# Patient Record
Sex: Male | Born: 1965 | Race: Black or African American | Hispanic: No | Marital: Single
Health system: Midwestern US, Community
[De-identification: ages and names within clinical notes are randomized; demographics above are authoritative.]

---

## 2010-11-23 IMAGING — CR Chest PA and Left Lateral
2 series · 2 of 2 positions shown · non-contrast
Comparison: none

Final Report

PA and lateral chest
HISTORY: Tbscreening;

[PA]
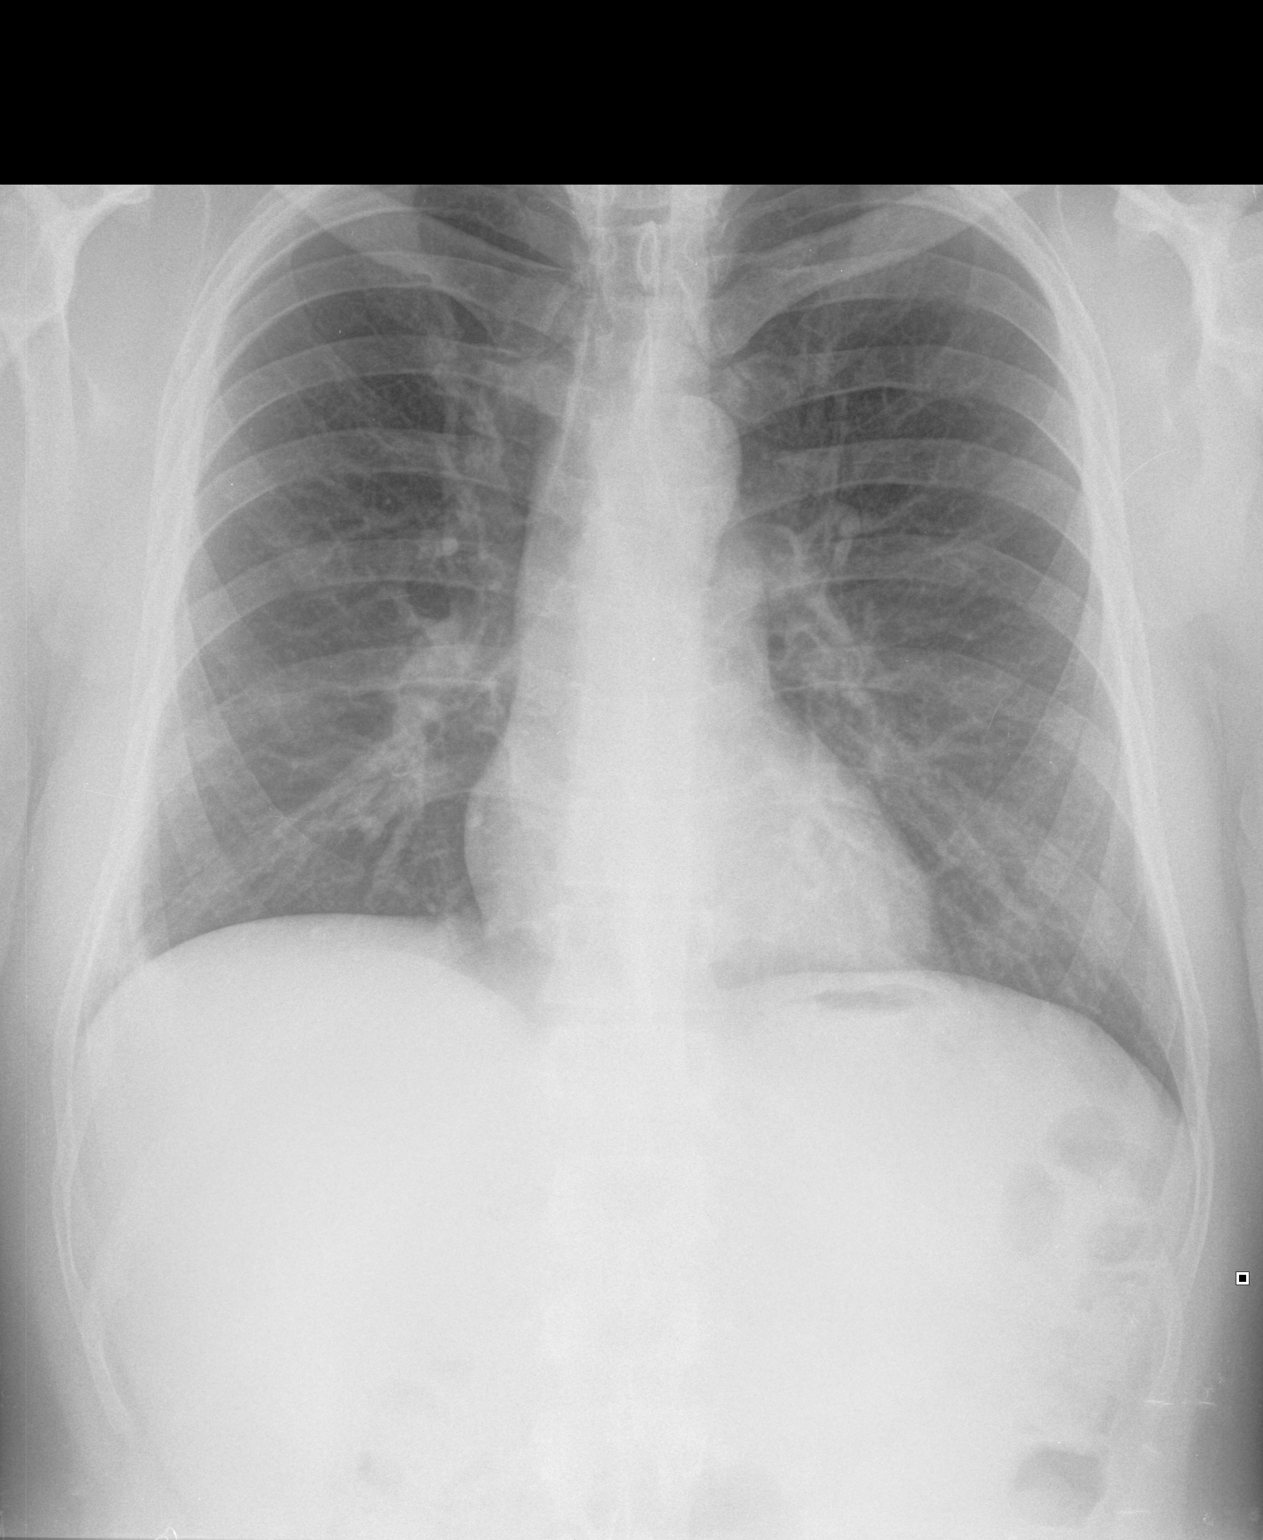

[left lateral]
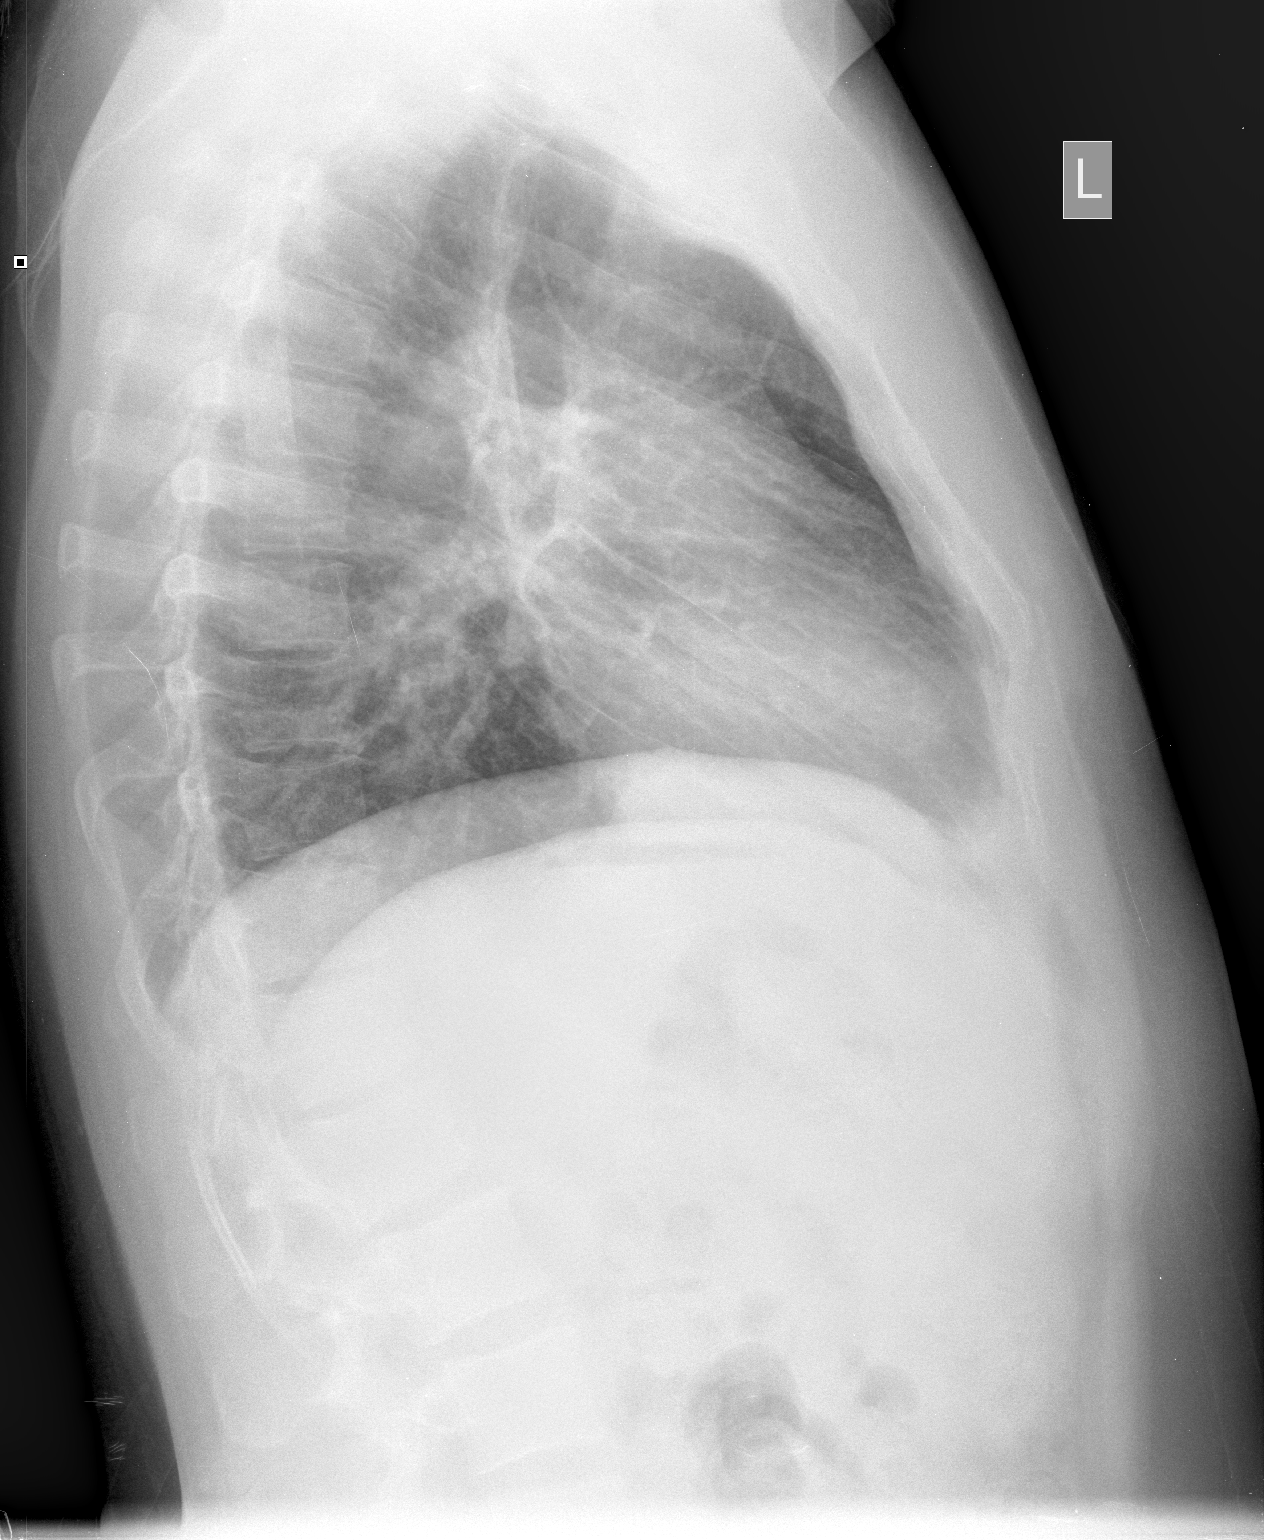

[2 of 2 positions shown; findings below may reference images not displayed]

FINDINGS: The heart size is normal.  No congestive heart failure or 

acute infiltrates.  Costophrenic angles are sharp and the trachea is 

midline.  Hila structures outline normally.

No significant bony abnormality is identified.
IMPRESSION: Negative PA and lateral chest x-ray.

## 2013-08-15 NOTE — ED Provider Notes (Addendum)
Patient is a 47 y.o. male presenting with cough. The history is provided by the patient.   Cough  This is a new problem. The current episode started more than 1 week ago. The problem occurs constantly. The problem has been gradually worsening. The cough is productive of sputum. There has been no fever. Associated symptoms include wheezing. Pertinent negatives include no chest pain, no chills, no sweats, no weight loss, no eye redness, no ear congestion, no ear pain, no headaches, no rhinorrhea, no sore throat, no myalgias, no shortness of breath, no nausea, no vomiting and no confusion. He has tried antibiotics for the symptoms. He is a smoker.      Patient arrived with chest congestion and cough that has been occuring for 2 months.  Patient states that 2 weeks ago he was given Zpack which helped on a short term basis but then it did not resolve completely.  Patient arrived for evaluation.     History reviewed. No pertinent past medical history.     History reviewed. No pertinent past surgical history.      History reviewed. No pertinent family history.     History     Social History   ??? Marital Status: SINGLE     Spouse Name: N/A     Number of Children: N/A   ??? Years of Education: N/A     Occupational History   ??? Not on file.     Social History Main Topics   ??? Smoking status: Heavy Tobacco Smoker -- 0.50 packs/day for 15 years   ??? Smokeless tobacco: Not on file   ??? Alcohol Use: Yes      Comment: socially   ??? Drug Use: No   ??? Sexually Active: Yes -- Male partner(s)     Other Topics Concern   ??? Not on file     Social History Narrative   ??? No narrative on file                  ALLERGIES: Review of patient's allergies indicates no known allergies.      Review of Systems   Constitutional: Negative.  Negative for chills and weight loss.   HENT: Negative.  Negative for ear pain, sore throat and rhinorrhea.    Eyes: Negative.  Negative for redness.   Respiratory: Positive for cough and wheezing. Negative for apnea,  choking, chest tightness, shortness of breath and stridor.    Cardiovascular: Negative.  Negative for chest pain.   Gastrointestinal: Negative.  Negative for nausea and vomiting.   Endocrine: Negative.    Genitourinary: Negative.    Musculoskeletal: Negative.  Negative for myalgias.   Skin: Negative.    Allergic/Immunologic: Negative.    Neurological: Negative.  Negative for headaches.   Hematological: Negative.    Psychiatric/Behavioral: Negative.  Negative for confusion.   All other systems reviewed and are negative.        Filed Vitals:    08/15/13 2003   BP: 133/83   Pulse: 90   Temp: 98.2 ??F (36.8 ??C)   Resp: 19   Height: 5\' 8"  (1.727 m)   Weight: 92.987 kg (205 lb)   SpO2: 98%            Physical Exam   Nursing note and vitals reviewed.  Constitutional: He is oriented to person, place, and time. He appears well-developed and well-nourished.   HENT:   Head: Normocephalic and atraumatic.   Right Ear: External ear normal.   Left  Ear: External ear normal.   Nose: Nose normal.   Mouth/Throat: Oropharynx is clear and moist.   Eyes: EOM are normal. Pupils are equal, round, and reactive to light.   Neck: Normal range of motion. Neck supple.   Cardiovascular: Normal rate, regular rhythm, normal heart sounds and intact distal pulses.    Pulmonary/Chest: Effort normal. No apnea. No respiratory distress. He has wheezes in the right upper field, the right middle field, the right lower field, the left upper field, the left middle field and the left lower field.   Abdominal: Soft. Bowel sounds are normal. He exhibits no distension. There is no tenderness.   Musculoskeletal: Normal range of motion.   Neurological: He is alert and oriented to person, place, and time. He has normal strength and normal reflexes. No cranial nerve deficit or sensory deficit.   Skin: Skin is warm and dry.   Psychiatric: He has a normal mood and affect. His behavior is normal. Judgment and thought content normal.        MDM     Amount and/or  Complexity of Data Reviewed:   Tests in the radiology section of CPT??:  Ordered and reviewed  Discussion of test results with the performing providers:  Yes   Decide to obtain previous medical records or to obtain history from someone other than the patient:  Yes   Obtain history from someone other than the patient:  Yes   Review and summarize past medical records:  Yes   Discuss the patient with another provider:  Yes   Independant visualization of image, tracing, or specimen:  Yes  Risk of Significant Complications, Morbidity, and/or Mortality:   Presenting problems:  Moderate  Diagnostic procedures:  Moderate  Management options:  Moderate  Progress:   Patient progress:  Stable      Procedures    Labs:  No results found for this or any previous visit (from the past 12 hour(s)).    EKG:    Radiology:  XR CHEST PA LAT (Final result)  Result time: 08/15/13 21:30:49      Final result by Chapman Fitch, MD (08/15/13 21:30:49)      Impression:    IMPRESSION:   No focal infiltrate or effusion        Narrative:    History: cough, chest congestion    Technique: PA and Lateral views of the chest 08/15/2013 8:20 PM    Comparison: none.     FINDINGS:   No focal infiltrate or effusion is identified.   The cardiac silhouette is unremarkable.  The visualized osseous structures appear unremarkable.          Re-evaluations:    Consults:    <EMERGENCY DEPARTMENT CASE SUMMARY>    Impression/Differential Diagnosis: bronchitis, wheezing    Plan: prednisone rx, alb rx, neb rx, mask and tubing rx, omnicef rx, tessalon pearles rx    ED Course: chest xray, duoneb, prednisone, patient's symptoms improved    Final Impression/Diagnosis: bronchitis, wheezing    Patient condition at time of disposition: stable      I have reviewed the following home medications:    Prior to Admission medications    Not on File       Debby Maisie Fus, [place]  I was personally available for consultation in the emergency department.  I have reviewed the chart recorded by  the [facility], including the assessment, treatment plan, and disposition.  Teola Bradley, MD

## 2013-08-15 NOTE — ED Notes (Signed)
Pt given 60mg  po prednisone and 1 duoneb tx pt on spo2  monitor

## 2013-08-15 NOTE — ED Notes (Signed)
Patient came to ED with chest congestion and yellowish phlegm for 2 months. Patient was given by PMD Z pak but still with symptoms.

## 2013-08-16 MED ADMIN — albuterol-ipratropium (DUO-NEB) 2.5 MG-0.5 MG/3 ML: RESPIRATORY_TRACT | NDC 00487020101

## 2013-08-16 MED ADMIN — prednisone (DELTASONE) tablet 60 mg: ORAL | NDC 00054001820

## 2013-11-25 LAB — DRUG SCREEN, URINE
AMPHETAMINES: POSITIVE — AB
BARBITURATES: NEGATIVE
BENZODIAZEPINES: NEGATIVE
COCAINE: NEGATIVE
METHADONE: POSITIVE — AB
OPIATES: POSITIVE — AB
PCP(PHENCYCLIDINE): NEGATIVE
THC (TH-CANNABINOL): POSITIVE — AB

## 2013-11-25 LAB — METABOLIC PANEL, BASIC
Anion gap: 7 mmol/L — ABNORMAL LOW (ref 10–20)
BUN: 14 mg/dL (ref 7–18)
CO2: 32 mmol/L (ref 21–32)
Calcium: 9.5 mg/dL (ref 8.5–10.1)
Chloride: 105 mmol/L (ref 98–107)
Creatinine: 1.1 mg/dL (ref 0.6–1.3)
GFR est AA: >60 mL/min/{1.73_m2} (ref >60)
GFR est non-AA: >60 mL/min/{1.73_m2} (ref >60)
Glucose: 130 mg/dL — ABNORMAL HIGH (ref 74–106)
Potassium: 4.3 mmol/L (ref 3.5–5.1)
Sodium: 140 mmol/L (ref 136–145)

## 2013-11-25 LAB — CBC WITH AUTOMATED DIFF
ABS. BASOPHILS: 0 10*3/uL (ref 0.0–0.1)
ABS. EOSINOPHILS: 0.3 10*3/uL (ref 0.0–0.7)
ABS. LYMPHOCYTES: 1.7 10*3/uL (ref 1.5–3.5)
ABS. MONOCYTES: 0.5 10*3/uL (ref 0.0–1.0)
ABS. NEUTROPHILS: 4.4 10*3/uL (ref 1.5–6.6)
BASOPHILS: 0 % (ref 0.0–3.0)
EOSINOPHILS: 4 % (ref 0.0–7.0)
HCT: 45.7 % (ref 42.0–52.0)
HGB: 14.9 g/dL (ref 14.0–18.0)
IMMATURE GRANULOCYTES: 0.4 % (ref 0.0–2.0)
LYMPHOCYTES: 25 % (ref 18.0–40.0)
MCH: 30.2 PG (ref 27.0–35.0)
MCHC: 32.6 g/dL (ref 30.7–37.3)
MCV: 92.5 FL (ref 81.0–94.0)
MONOCYTES: 8 % (ref 2.0–12.0)
MPV: 8.9 FL — ABNORMAL LOW (ref 9.2–11.8)
NEUTROPHILS: 63 % (ref 48.0–72.0)
PLATELET: 217 10*3/uL (ref 130–400)
RBC: 4.94 M/uL (ref 4.70–6.00)
RDW: 13.2 % (ref 11.5–14.0)
WBC: 7 10*3/uL (ref 4.8–10.6)

## 2013-11-25 LAB — URINALYSIS W/ RFLX MICROSCOPIC
Bilirubin: NEGATIVE
Blood: NEGATIVE
Glucose: NEGATIVE mg/dL
Ketone: NEGATIVE mg/dL
Leukocyte Esterase: NEGATIVE
Nitrites: NEGATIVE
Protein: NEGATIVE mg/dL
Specific gravity: 1.023 (ref 1.003–1.030)
Urobilinogen: 0.2 EU/dL (ref 0.2–1.0)
pH (UA): 5.5 (ref 4.6–8.0)

## 2013-11-25 LAB — ACETAMINOPHEN: Acetaminophen level: <2 ug/mL — ABNORMAL LOW (ref 10.0–30.0)

## 2013-11-25 LAB — ETHYL ALCOHOL: ETHYL ALCOHOL, SERUM: <3 MG/DL (ref <10)

## 2013-11-25 LAB — SALICYLATE: Salicylate level: 2.5 MG/DL — ABNORMAL LOW (ref 2.8–20.0)

## 2013-11-25 NOTE — ED Notes (Signed)
Pt discharged to home with post ED care instructions.  Care explained to pt who verbalized understanding of same.

## 2013-11-25 NOTE — ED Provider Notes (Signed)
HPI Comments: This is a 47 yo male came to ER requesting for Detox from heroin . Last use was about at 8 pm . Patient usually uses 5 bags a day . Denied suicidal or homicidal ideation in ER.     Patient is a 47 y.o. male presenting with drug problem. The history is provided by the patient.   Drug Problem  There areno confusion, no somnolence, no loss of consciousness, no seizures, no weakness, no agitation, no delusions, no hallucinations, no self-injury, no violence and no intoxication present at this time.  This is a recurrent problem. The problem has not changed since onset.Suspected agents include heroin. Pertinent negatives include no fever, no injury, no nausea, no vomiting, no bladder incontinence and no bowel incontinence. Associated medical issues include addiction treatment. Associated medical issues do not include withdrawal syndrome, chronic illness, mental illness, psychiatric history, recent illness, recent infection, suicidal ideas, homicidal ideas, mental status change and depression.        Past Medical History   Diagnosis Date   ??? Asthma         History reviewed. No pertinent past surgical history.      History reviewed. No pertinent family history.     History     Social History   ??? Marital Status: SINGLE     Spouse Name: N/A     Number of Children: N/A   ??? Years of Education: N/A     Occupational History   ??? Not on file.     Social History Main Topics   ??? Smoking status: Heavy Tobacco Smoker -- 0.50 packs/day for 15 years   ??? Smokeless tobacco: Not on file   ??? Alcohol Use: Yes      Comment: socially   ??? Drug Use: No   ??? Sexually Active: Yes -- Male partner(s)     Other Topics Concern   ??? Not on file     Social History Narrative   ??? No narrative on file                  ALLERGIES: Review of patient's allergies indicates no known allergies.      Review of Systems   Constitutional: Negative.  Negative for fever.   HENT: Negative.    Eyes: Negative.    Respiratory: Negative.    Gastrointestinal:  Negative for nausea, vomiting and bowel incontinence.   Endocrine: Negative.    Genitourinary: Negative.  Negative for bladder incontinence.   Allergic/Immunologic: Negative.    Neurological: Negative.  Negative for seizures, loss of consciousness and weakness.   Psychiatric/Behavioral: Positive for behavioral problems. Negative for depression, suicidal ideas, homicidal ideas, hallucinations, confusion, self-injury and agitation.       Filed Vitals:    11/25/13 1103   BP: 150/92   Pulse: 71   Temp: 97.8 ??F (36.6 ??C)   Resp: 18   Height: 5\' 8"  (1.727 m)   Weight: 95.255 kg (210 lb)   SpO2: 98%            Physical Exam   Nursing note and vitals reviewed.  Constitutional: He is oriented to person, place, and time. He appears well-developed and well-nourished.   HENT:   Head: Normocephalic and atraumatic.   Eyes: Conjunctivae and EOM are normal. Pupils are equal, round, and reactive to light.   Neck: Normal range of motion. Neck supple.   Neurological: He is alert and oriented to person, place, and time. He has normal reflexes.  Skin: Skin is warm and dry.   Psychiatric: He has a normal mood and affect. Thought content normal.        MDM    Procedures    <EMERGENCY DEPARTMENT CASE SUMMARY>        ED Course:    Recent Results (from the past 12 hour(s))   CBC WITH AUTOMATED DIFF    Collection Time     11/25/13  1:00 PM       Result Value Range    WBC 7.0  4.8 - 10.6 K/uL    RBC 4.94  4.70 - 6.00 M/uL    HGB 14.9  14.0 - 18.0 g/dL    HCT 16.1  09.6 - 04.5 %    MCV 92.5  81.0 - 94.0 FL    MCH 30.2  27.0 - 35.0 PG    MCHC 32.6  30.7 - 37.3 g/dL    RDW 40.9  81.1 - 91.4 %    PLATELET 217  130 - 400 K/uL    MPV 8.9 (*) 9.2 - 11.8 FL    NEUTROPHILS 63  48.0 - 72.0 %    LYMPHOCYTES 25  18.0 - 40.0 %    MONOCYTES 8  2.0 - 12.0 %    EOSINOPHILS 4  0.0 - 7.0 %    BASOPHILS 0  0.0 - 3.0 %    ABS. NEUTROPHILS 4.4  1.5 - 6.6 K/UL    ABS. LYMPHOCYTES 1.7  1.5 - 3.5 K/UL    ABS. MONOCYTES 0.5  0.0 - 1.0 K/UL    ABS. EOSINOPHILS 0.3   0.0 - 0.7 K/UL    ABS. BASOPHILS 0.0  0.0 - 0.1 K/UL    DF AUTOMATED      IMMATURE GRANULOCYTES 0.4  0.0 - 2.0 %   METABOLIC PANEL, BASIC    Collection Time     11/25/13  1:00 PM       Result Value Range    Sodium 140  136 - 145 mmol/L    Potassium 4.3  3.5 - 5.1 mmol/L    Chloride 105  98 - 107 mmol/L    CO2 32  21 - 32 mmol/L    Anion gap 7 (*) 10 - 20 mmol/L    Glucose 130 (*) 74 - 106 mg/dL    BUN 14  7 - 18 mg/dL    Creatinine 1.1  0.6 - 1.3 mg/dL    GFR est AA >78  >29 ml/min/1.36m2    GFR est non-AA >60  >60 ml/min/1.75m2    Calcium 9.5  8.5 - 10.1 mg/dL   ETHYL ALCOHOL    Collection Time     11/25/13  1:00 PM       Result Value Range    ETHYL ALCOHOL, SERUM <3.0  <10 MG/DL   ACETAMINOPHEN    Collection Time     11/25/13  1:00 PM       Result Value Range    ACETAMINOPHEN <2 (*) 10.0 - 30.0 ug/mL   SALICYLATE    Collection Time     11/25/13  1:00 PM       Result Value Range    SALICYLATE 2.5 (*) 2.8 - 20.0 MG/DL         Patient was seen by Kirt Boys ( Mental health screener) who evaluate the patient.     Patient insurance is not active. Patient decide to leave     Final Impression/Diagnosis: substance abuse     Patient  condition at time of disposition: stable       I have reviewed the following home medications:    Prior to Admission medications    Medication Sig Start Date End Date Taking? Authorizing Provider   albuterol (PROVENTIL HFA, VENTOLIN HFA) 90 mcg/actuation inhaler Take 2 puffs by inhalation every four (4) hours as needed for Wheezing. 08/15/13   Debby Thomas, PA-C, MS   albuterol (ACCUNEB) 1.25 mg/3 mL nebulizer solution Take 3 mL by inhalation every six (6) hours as needed for Wheezing. 08/15/13   Debby Maisie Fus, PA-C, MS   Nebulizers misc 1 unit dose vial every 4-6 hours as needed for wheezing. 08/15/13   Debby Storm Frisk, MS   Nebulizer Accessories kit Mask and tubing 08/15/13   Debby Storm Frisk, MS         [place], [place]

## 2013-11-25 NOTE — ED Notes (Signed)
Pt received in waiting room.  Alert and oriented x3.  Pt ambulates to ED room with steady gait.   Pt awaiting provider evaluation.

## 2013-11-25 NOTE — ED Notes (Signed)
Pt requesting detox from heroin states he last used 5 bags yesterday at 8pm

## 2013-11-25 NOTE — ED Provider Notes (Signed)
I was personally available for consultation in the emergency department.  I have reviewed the chart and agree with the documentation recorded by the midlevel provider, including the assessment, treatment plan, and disposition.    [name] [name], MD

## 2014-11-25 ENCOUNTER — Emergency Department: Admit: 2014-11-25 | Payer: MEDICAID | Primary: Family Medicine

## 2014-11-25 ENCOUNTER — Inpatient Hospital Stay
Admit: 2014-11-25 | Discharge: 2014-11-25 | Disposition: A | Discharge status: Home / Self Care | Payer: MEDICAID | Attending: Emergency Medicine

## 2014-11-25 DIAGNOSIS — S8982XA Other specified injuries of left lower leg, initial encounter: Secondary | ICD-10-CM

## 2014-11-25 MED ORDER — OXYCODONE-ACETAMINOPHEN 5 MG-325 MG TAB
5-325 mg | ORAL_TABLET | ORAL | Status: DC | PRN
Start: 2014-11-25 — End: 2014-12-23

## 2014-11-25 MED ORDER — OXYCODONE-ACETAMINOPHEN 5 MG-325 MG TAB
5-325 mg | ORAL | Status: AC
Start: 2014-11-25 — End: 2014-11-25
  Administered 2014-11-25: 18:00:00 via ORAL

## 2014-11-25 MED FILL — OXYCODONE-ACETAMINOPHEN 5 MG-325 MG TAB: 5-325 mg | ORAL | Qty: 1

## 2014-11-25 NOTE — ED Notes (Signed)
Knee immobilizer. Crutch training completed. Patient discharged home with rx and follow up instructions.

## 2014-11-25 NOTE — ED Notes (Signed)
Twisted lt knee 2 days ago

## 2014-11-25 NOTE — ED Provider Notes (Addendum)
HPI Comments: Patient reports that he twisted his left knee. He reports that his has increased pain and weight bearing worst to his posterior knee. Denies weakness, numbness or tingling. Reports that he did not take any medications for pain.     Patient is a 48 y.o. male presenting with knee injury. The history is provided by the patient.   Knee Injury   This is a new problem. The pain is at a severity of 7/10. Pertinent negatives include no numbness, full range of motion, no stiffness, no tingling, no itching, no back pain and no neck pain. There has been no history of extremity trauma.        Past Medical History:   Diagnosis Date   ??? Asthma        History reviewed. No pertinent past surgical history.      History reviewed. No pertinent family history.    History     Social History   ??? Marital Status: SINGLE     Spouse Name: N/A     Number of Children: N/A   ??? Years of Education: N/A     Occupational History   ??? Not on file.     Social History Main Topics   ??? Smoking status: Heavy Tobacco Smoker -- 0.50 packs/day for 15 years   ??? Smokeless tobacco: Not on file   ??? Alcohol Use: Yes      Comment: socially   ??? Drug Use: No   ??? Sexual Activity:     Partners: Female     Other Topics Concern   ??? Not on file     Social History Narrative                ALLERGIES: Review of patient's allergies indicates no known allergies.      Review of Systems   Constitutional: Negative.  Negative for fever and chills.   HENT: Negative.    Respiratory: Negative.  Negative for cough and shortness of breath.    Cardiovascular: Negative.  Negative for chest pain and palpitations.   Gastrointestinal: Negative.  Negative for nausea, vomiting and abdominal pain.   Genitourinary: Negative.    Musculoskeletal: Positive for myalgias and arthralgias. Negative for back pain, joint swelling, gait problem, stiffness and neck pain.   Skin: Negative.  Negative for itching.   Neurological: Negative.  Negative for tingling, syncope, light-headedness,  numbness and headaches.   Hematological: Negative.        Filed Vitals:    11/25/14 1252   BP: 133/81   Pulse: 81   Temp: 98.6 ??F (37 ??C)   Resp: 18   Height: '5\' 8"'  (1.727 m)   Weight: 102.059 kg (225 lb)            Physical Exam   Constitutional: He is oriented to person, place, and time. He appears well-developed and well-nourished. No distress.   Cardiovascular: Regular rhythm and normal heart sounds.    Pulmonary/Chest: Breath sounds normal. No respiratory distress.   Abdominal: Soft. Bowel sounds are normal. He exhibits no distension. There is no tenderness.   Musculoskeletal: He exhibits tenderness. He exhibits no edema.        Left knee: He exhibits bony tenderness. He exhibits normal range of motion, no swelling, no effusion, no ecchymosis, no deformity, no laceration, no erythema, normal alignment, no LCL laxity, normal meniscus and no MCL laxity. Tenderness found. Medial joint line and lateral joint line tenderness noted.   Neurological: He is alert and  oriented to person, place, and time. He displays normal reflexes. No cranial nerve deficit. Coordination normal.   Skin: Skin is warm and dry. He is not diaphoretic.   Nursing note and vitals reviewed.       MDM  Number of Diagnoses or Management Options  Left knee pain:      Amount and/or Complexity of Data Reviewed  Tests in the radiology section of CPT??: ordered and reviewed  Discussion of test results with the performing providers: yes  Discuss the patient with other providers: yes  Independent visualization of images, tracings, or specimens: yes        Procedures                                               XR KNEE LT 3 V (Final result) Result time: 11/25/14 13:43:33    Final result by Nonie Hoyer, MD (11/25/14 13:43:33)    Impression:    IMPRESSION: Normal study.      Narrative:    LEFT KNEE 3 views    History: Pain.    There is no evidence of fracture, dislocation, subluxation, joint space  narrowing, arthritis or effusion.                   <EMERGENCY DEPARTMENT CASE SUMMARY>    Impression/Differential Diagnosis: knee pain     ED Course: left knee xray, percocet-knee immobilizer, crutches and patient agrees to follow up with orthopedic MD in one day as well as RTER for any worsening conditions    Final Impression/Diagnosis: knee pain     Patient condition at time of disposition: stable       I have reviewed the following home medications:    Prior to Admission medications    Medication Sig Start Date End Date Taking? Authorizing Provider   oxyCODONE-acetaminophen (PERCOCET) 5-325 mg per tablet Take 1 Tab by mouth every four (4) hours as needed for Pain. Max Daily Amount: 6 Tabs. 11/25/14  Yes Richelle Ito, NP   albuterol (PROVENTIL HFA, VENTOLIN HFA) 90 mcg/actuation inhaler Take 2 puffs by inhalation every four (4) hours as needed for Wheezing. 08/15/13   Debby Thomas, PA-C, MS   albuterol (ACCUNEB) 1.25 mg/3 mL nebulizer solution Take 3 mL by inhalation every six (6) hours as needed for Wheezing. 08/15/13   Debby Marcello Moores, PA-C, MS   Nebulizers misc 1 unit dose vial every 4-6 hours as needed for wheezing. 08/15/13   Debby Cyndie Mull, MS   Nebulizer Accessories kit Mask and tubing 08/15/13   Debby Cyndie Mull, MS         Richelle Ito, NP    I was personally available for consultation in the emergency department. I have reviewed the chart and agree with the documentation recorded by the midlevel provider, including the assessment, treatment plan, and disposition.    Wylene Simmer, MD

## 2014-12-23 ENCOUNTER — Inpatient Hospital Stay
Admit: 2014-12-23 | Discharge: 2014-12-24 | Disposition: A | Discharge status: Home / Self Care | Payer: MEDICAID | Attending: Emergency Medicine

## 2014-12-23 DIAGNOSIS — J45901 Unspecified asthma with (acute) exacerbation: Secondary | ICD-10-CM

## 2014-12-23 MED ORDER — IPRATROPIUM-ALBUTEROL 2.5 MG-0.5 MG/3 ML NEB SOLUTION
2.5 mg-0.5 mg/3 ml | RESPIRATORY_TRACT | Status: AC
Start: 2014-12-23 — End: 2014-12-23
  Administered 2014-12-23: via RESPIRATORY_TRACT

## 2014-12-23 MED FILL — IPRATROPIUM-ALBUTEROL 2.5 MG-0.5 MG/3 ML NEB SOLUTION: 2.5 mg-0.5 mg/3 ml | RESPIRATORY_TRACT | Qty: 9

## 2014-12-23 NOTE — ED Notes (Signed)
States started to feel sob approx 1 hour ago. States has asthma

## 2014-12-23 NOTE — ED Provider Notes (Signed)
HPI Comments: 7:27 PM  Chief complaint: wheezing  History obtained from patient  Andrew Pearson is a 49 y.o. male who presents to the Emergency Dept with c/o wheezing. States that he began to feel SOB approx 1-2 hours ago, similar to previous episodes of asthma. Normally uses albuterol at home, but ran out of his medication. Takes Qvar daily as preventive measure. Denies c/o fever, chills, N/V/D, chest pain, dizziness. Reports cough, which is chronic. Admits to smoking daily. No other complaints at this time.       Past Medical History:   Diagnosis Date   ??? Asthma        History reviewed. No pertinent past surgical history.      History reviewed. No pertinent family history.    History     Social History   ??? Marital Status: SINGLE     Spouse Name: N/A     Number of Children: N/A   ??? Years of Education: N/A     Occupational History   ??? Not on file.     Social History Main Topics   ??? Smoking status: Heavy Tobacco Smoker -- 0.50 packs/day for 15 years   ??? Smokeless tobacco: Not on file   ??? Alcohol Use: Yes      Comment: socially   ??? Drug Use: Not on file   ??? Sexual Activity:     Partners: Female     Other Topics Concern   ??? Not on file     Social History Narrative                ALLERGIES: Review of patient's allergies indicates no known allergies.      Review of Systems   Constitutional: Negative for fever and chills.   HENT: Negative for sore throat.    Respiratory: Positive for cough, shortness of breath and wheezing.    Cardiovascular: Negative for chest pain.   Gastrointestinal: Negative for nausea, vomiting, abdominal pain and diarrhea.   Musculoskeletal: Negative for back pain and neck pain.   Skin: Negative for rash.   Allergic/Immunologic: Negative for immunocompromised state.   Neurological: Negative for dizziness and headaches.   Psychiatric/Behavioral: The patient is not nervous/anxious.        Filed Vitals:    12/23/14 1837   BP: 119/73   Pulse: 64   Temp: 98.2 ??F (36.8 ??C)   Resp: 20    Height:  (1.753 m)   Weight: 99.791 kg (220 lb)   SpO2: 96%            Physical Exam   Constitutional: He is oriented to person, place, and time. Vital signs are normal. He appears well-developed and well-nourished. No distress.   HENT:   Head: Normocephalic and atraumatic.   Eyes: Conjunctivae are normal.   Neck: Normal range of motion. Neck supple.   Cardiovascular: Normal rate and regular rhythm.    Pulmonary/Chest: Effort normal. No respiratory distress. He has wheezes (scattered at bases).   Neurological: He is alert and oriented to person, place, and time. GCS eye subscore is 4. GCS verbal subscore is 5. GCS motor subscore is 6.   Skin: Skin is warm and dry. He is not diaphoretic.   Psychiatric:   Calm and cooperative    Nursing note and vitals reviewed.       MDM  Number of Diagnoses or Management Options  Asthma with exacerbation, unspecified asthma severity:      Amount and/or Complexity of Data  Reviewed  Review and summarize past medical records: yes        Procedures      <EMERGENCY DEPARTMENT CASE SUMMARY>    Impression/Differential Diagnosis: asthma exacerbation    Plan: duoneb, prednisone    ED Course: Pt is a  49 y.o. male who presents to the ED with c/o SOB. Similar to previous asthma exacerbations. Non-distressed while in the ED. Improved with duonebs and prednisone. Stable for d/c, to continue prednisone for 4 days. To follow up with PCP. Rx albuterol provided.    D/w patient dx, tx plan, and need for follow-up. Pt advised to RTER for new or worsening symptoms. The patient verbalized understanding. All questions answered, and the patient agrees with the plan.    Final Impression/Diagnosis:   1. Asthma with exacerbation, unspecified asthma severity        Patient condition at time of disposition:  stable     I have reviewed the following home medications:    Prior to Admission medications    Medication Sig Start Date End Date Taking? Authorizing Provider    albuterol (PROVENTIL HFA, VENTOLIN HFA, PROAIR HFA) 90 mcg/actuation inhaler Take 2 Puffs by inhalation every four (4) hours as needed for Wheezing. 12/23/14  Yes Peggye Formina E [place]ston [place], NP   predniSONE (DELTASONE) 20 mg tablet Take 3 Tabs by mouth daily for 4 days. 12/23/14 12/27/14 Yes Peggye Formina E Ammon Muscatello, NP       Peggye Formina E Cecilie Heidel, NP

## 2014-12-24 MED ORDER — PREDNISONE 20 MG TAB
20 mg | ORAL | Status: AC
Start: 2014-12-24 — End: 2014-12-23
  Administered 2014-12-24: 01:00:00 via ORAL

## 2014-12-24 MED ORDER — ALBUTEROL SULFATE HFA 90 MCG/ACTUATION AEROSOL INHALER
90 mcg/actuation | RESPIRATORY_TRACT | Status: DC
Start: 2014-12-24 — End: 2014-12-24

## 2014-12-24 MED ORDER — PREDNISONE 20 MG TAB
20 mg | ORAL_TABLET | Freq: Every day | ORAL | Status: AC
Start: 2014-12-24 — End: 2014-12-27

## 2014-12-24 MED ORDER — ALBUTEROL SULFATE HFA 90 MCG/ACTUATION AEROSOL INHALER
90 mcg/actuation | RESPIRATORY_TRACT | Status: DC | PRN
Start: 2014-12-24 — End: 2015-05-10

## 2014-12-24 MED ORDER — IPRATROPIUM-ALBUTEROL 2.5 MG-0.5 MG/3 ML NEB SOLUTION
2.5 mg-0.5 mg/3 ml | RESPIRATORY_TRACT | Status: AC
Start: 2014-12-24 — End: 2014-12-23
  Administered 2014-12-24 (×3): via RESPIRATORY_TRACT

## 2014-12-24 MED FILL — IPRATROPIUM-ALBUTEROL 2.5 MG-0.5 MG/3 ML NEB SOLUTION: 2.5 mg-0.5 mg/3 ml | RESPIRATORY_TRACT | Qty: 3

## 2014-12-24 MED FILL — PREDNISONE 20 MG TAB: 20 mg | ORAL | Qty: 3

## 2014-12-24 MED FILL — VENTOLIN HFA 90 MCG/ACTUATION AEROSOL INHALER: 90 mcg/actuation | RESPIRATORY_TRACT | Qty: 8

## 2015-05-10 DIAGNOSIS — J4521 Mild intermittent asthma with (acute) exacerbation: Secondary | ICD-10-CM

## 2015-05-10 NOTE — ED Notes (Signed)
Pt medicated as documented. Re-evaluated by Dr. Selena BattenKim. Pt Discharged home. Discharge instructions, follow up instructions, and prescriptions discussed with patient. Pt ambulatory from ED. Gait steady.

## 2015-05-10 NOTE — ED Notes (Signed)
Pt states" i'm having an asthma attack."

## 2015-05-10 NOTE — ED Provider Notes (Signed)
The history is provided by the patient.     10:44 PM: Andrew Pearson is a 49 y.o. male with h/o asthma who presents to the ED c/o SOB. Per wife, pt has asthma attacks once every 2 months secondary to weather changes. Pt reports he ran out of his inhaler which has worsened his asthma. Pt denies fever. There are no other complaints at this time.     Past Medical History:   Diagnosis Date   ??? Asthma        History reviewed. No pertinent past surgical history.      History reviewed. No pertinent family history.    History     Social History   ??? Marital Status: SINGLE     Spouse Name: N/A   ??? Number of Children: N/A   ??? Years of Education: N/A     Occupational History   ??? Not on file.     Social History Main Topics   ??? Smoking status: Heavy Tobacco Smoker -- 0.50 packs/day for 15 years   ??? Smokeless tobacco: Not on file   ??? Alcohol Use: Yes      Comment: socially   ??? Drug Use: Not on file   ??? Sexual Activity:     Partners: Female     Other Topics Concern   ??? Not on file     Social History Narrative           ALLERGIES: Review of patient's allergies indicates no known allergies.      Review of Systems   Constitutional: Negative for fever and chills.   HENT: Negative for rhinorrhea and sore throat.    Eyes: Negative for photophobia and visual disturbance.   Respiratory: Positive for shortness of breath. Negative for cough.    Cardiovascular: Negative for chest pain and leg swelling.   Gastrointestinal: Negative for nausea, vomiting, abdominal pain and diarrhea.   Genitourinary: Negative for dysuria and hematuria.   Musculoskeletal: Negative for myalgias and back pain.   Skin: Negative for color change and pallor.   Neurological: Negative for seizures and syncope.       Filed Vitals:    05/10/15 2226   BP: 143/86   Pulse: 72   Temp: 98 ??F (36.7 ??C)   Resp: 22   Height:  (1.727 m)   Weight: 102.059 kg (225 lb)   SpO2: 96%   10:48 PM Pulse Oximetry reading is 96 % on room air, which indicates  normal oxygenation per Bronson Curb, MD.             Physical Exam   Constitutional: He is oriented to person, place, and time. He appears well-developed and well-nourished. No distress.   HENT:   Head: Normocephalic and atraumatic.   Eyes: EOM are normal. Pupils are equal, round, and reactive to light.   Neck: Neck supple. No JVD present.   Cardiovascular: Normal rate, regular rhythm and normal heart sounds.    Pulmonary/Chest: Effort normal. No respiratory distress. He has wheezes (trace bilaterally). He has no rales.   Abdominal: Soft. Bowel sounds are normal. He exhibits no distension. There is no tenderness. There is no rebound and no guarding.   Musculoskeletal: Normal range of motion. He exhibits no edema or tenderness.   Neurological: He is alert and oriented to person, place, and time.   Skin: Skin is warm and dry.   Psychiatric: He has a normal mood and affect.   Nursing note and vitals reviewed.  MDM  Number of Diagnoses or Management Options     Amount and/or Complexity of Data Reviewed  Decide to obtain previous medical records or to obtain history from someone other than the patient: yes  Review and summarize past medical records: yes  Independent visualization of images, tracings, or specimens: yes        Procedures    I, Bronson Curbobin S Berthel Bagnall, MD, reviewed the patient's past history, allergies and home medications as documented in the nursing chart.    <EMERGENCY DEPARTMENT CASE SUMMARY>    Impression/Differential Diagnosis: asthma exacerbation    ED Course:   49 y.o. male presented to the ED c/o SOB.   Give Duo-nebx2, albuterol inhaler and deltasone.     10:50 PM Patient was reassessed prior to discharge. Patient???s symptoms Improved. I personally discussed discharge plan with patient/guardian, who understands instructions. All questions were answered.  Prescription for Deltasone and albuterol inhaler given. Patient/guardian advised to follow  up with PMD and/or specialist. Patient/guardian instructed to return to the ER if symptoms persist, change or worsen. Patient agrees with plan.      Final Impression/Diagnosis:   Encounter Diagnoses     ICD-10-CM ICD-9-CM   1. Mild intermittent asthma with acute exacerbation J45.21 493.92       Patient condition at time of disposition: stable      I have reviewed the following home medications:    Prior to Admission medications    Not on File       Bronson Curbobin S Marcquis Ridlon, MD      I, Leana RoeKatherine Mathew, am serving as a scribe to document services personally performed by Bronson Curbobin S Dezirea Mccollister, MD based on my observation and the provider's statements to me.    I, Bronson Curbobin S Sabastion Hrdlicka, MD, attest that the person(s) noted above, acting as my scribe(s) noted above, has observed my performance of the services and has documented them in accordance with my direction. I have personally reviewed the above information and have ordered and reviewed the diagnostic studies, unless otherwise noted.

## 2015-05-11 ENCOUNTER — Inpatient Hospital Stay
Admit: 2015-05-11 | Discharge: 2015-05-11 | Disposition: A | Discharge status: Home / Self Care | Payer: MEDICAID | Attending: Internal Medicine

## 2015-05-11 MED ORDER — ALBUTEROL SULFATE HFA 90 MCG/ACTUATION AEROSOL INHALER
90 mcg/actuation | RESPIRATORY_TRACT | Status: AC
Start: 2015-05-11 — End: 2015-05-10

## 2015-05-11 MED ORDER — PREDNISONE 20 MG TAB
20 mg | ORAL_TABLET | Freq: Every day | ORAL | Status: AC
Start: 2015-05-11 — End: 2015-05-15

## 2015-05-11 MED ORDER — IPRATROPIUM-ALBUTEROL 2.5 MG-0.5 MG/3 ML NEB SOLUTION
2.5 mg-0.5 mg/3 ml | Freq: Once | RESPIRATORY_TRACT | Status: AC
Start: 2015-05-11 — End: 2015-05-10
  Administered 2015-05-11: 03:00:00 via RESPIRATORY_TRACT

## 2015-05-11 MED ORDER — ALBUTEROL SULFATE HFA 90 MCG/ACTUATION AEROSOL INHALER
90 mcg/actuation | RESPIRATORY_TRACT | Status: AC
Start: 2015-05-11 — End: 2015-05-10
  Administered 2015-05-11: 03:00:00 via RESPIRATORY_TRACT

## 2015-05-11 MED ORDER — PREDNISONE 20 MG TAB
20 mg | ORAL | Status: AC
Start: 2015-05-11 — End: 2015-05-10
  Administered 2015-05-11: 03:00:00 via ORAL

## 2015-05-11 MED FILL — PREDNISONE 20 MG TAB: 20 mg | ORAL | Qty: 3

## 2015-05-11 MED FILL — VENTOLIN HFA 90 MCG/ACTUATION AEROSOL INHALER: 90 mcg/actuation | RESPIRATORY_TRACT | Qty: 8

## 2015-05-11 MED FILL — IPRATROPIUM-ALBUTEROL 2.5 MG-0.5 MG/3 ML NEB SOLUTION: 2.5 mg-0.5 mg/3 ml | RESPIRATORY_TRACT | Qty: 3

## 2015-07-30 ENCOUNTER — Inpatient Hospital Stay
Admit: 2015-07-30 | Discharge: 2015-07-30 | Disposition: A | Discharge status: Home / Self Care | Payer: MEDICAID | Attending: Internal Medicine

## 2015-07-30 DIAGNOSIS — J45901 Unspecified asthma with (acute) exacerbation: Secondary | ICD-10-CM

## 2015-07-30 MED ORDER — ALBUTEROL SULFATE HFA 90 MCG/ACTUATION AEROSOL INHALER
90 mcg/actuation | RESPIRATORY_TRACT | 0 refills | Status: DC | PRN
Start: 2015-07-30 — End: 2015-10-11

## 2015-07-30 MED ORDER — ALBUTEROL SULFATE HFA 90 MCG/ACTUATION AEROSOL INHALER
90 mcg/actuation | RESPIRATORY_TRACT | Status: AC
Start: 2015-07-30 — End: 2015-07-30
  Administered 2015-07-30: 15:00:00 via RESPIRATORY_TRACT

## 2015-07-30 MED ORDER — IPRATROPIUM-ALBUTEROL 2.5 MG-0.5 MG/3 ML NEB SOLUTION
2.5 mg-0.5 mg/3 ml | RESPIRATORY_TRACT | Status: AC
Start: 2015-07-30 — End: 2015-07-30

## 2015-07-30 MED ORDER — IPRATROPIUM-ALBUTEROL 2.5 MG-0.5 MG/3 ML NEB SOLUTION
2.5 mg-0.5 mg/3 ml | Freq: Once | RESPIRATORY_TRACT | Status: AC
Start: 2015-07-30 — End: 2015-07-30
  Administered 2015-07-30: 15:00:00 via RESPIRATORY_TRACT

## 2015-07-30 MED ORDER — PREDNISONE 20 MG TAB
20 mg | ORAL_TABLET | Freq: Every day | ORAL | 0 refills | Status: AC
Start: 2015-07-30 — End: 2015-08-03

## 2015-07-30 MED ORDER — PREDNISONE 20 MG TAB
20 mg | ORAL | Status: AC
Start: 2015-07-30 — End: 2015-07-30
  Administered 2015-07-30: 15:00:00 via ORAL

## 2015-07-30 MED ORDER — IPRATROPIUM-ALBUTEROL 2.5 MG-0.5 MG/3 ML NEB SOLUTION
2.5 mg-0.5 mg/3 ml | RESPIRATORY_TRACT | Status: AC
Start: 2015-07-30 — End: 2015-07-30
  Administered 2015-07-30: 14:00:00 via RESPIRATORY_TRACT

## 2015-07-30 MED FILL — IPRATROPIUM-ALBUTEROL 2.5 MG-0.5 MG/3 ML NEB SOLUTION: 2.5 mg-0.5 mg/3 ml | RESPIRATORY_TRACT | Qty: 3

## 2015-07-30 MED FILL — PREDNISONE 20 MG TAB: 20 mg | ORAL | Qty: 3

## 2015-07-30 MED FILL — VENTOLIN HFA 90 MCG/ACTUATION AEROSOL INHALER: 90 mcg/actuation | RESPIRATORY_TRACT | Qty: 8

## 2015-07-30 NOTE — ED Provider Notes (Signed)
The history is provided by the patient.      10:21 AM: Andrew Pearson is a 49 y.o. male who presents to the ED c/o shortness of breath for the past 1.5 hours. The patient states that he gets " asthma attacks"almost daily, and he states that they start suddenly in "weird ways" (if he coughs, or swallows something the wrong way etc). The patient states that he ran out of his inhaler today. He does not have a PCP. He has no other complaints at this time.    Past Medical History:   Diagnosis Date   ??? Asthma        History reviewed. No pertinent past surgical history.      History reviewed. No pertinent family history.    Social History     Social History   ??? Marital status: SINGLE     Spouse name: N/A   ??? Number of children: N/A   ??? Years of education: N/A     Occupational History   ??? Not on file.     Social History Main Topics   ??? Smoking status: Heavy Tobacco Smoker     Packs/day: 0.50     Years: 15.00   ??? Smokeless tobacco: Not on file   ??? Alcohol use Yes      Comment: socially   ??? Drug use: Not on file   ??? Sexual activity: Yes     Partners: Female     Other Topics Concern   ??? Not on file     Social History Narrative         ALLERGIES: Review of patient's allergies indicates no known allergies.    Review of Systems   Constitutional: Negative for chills, diaphoresis and fever.   HENT: Negative for congestion, ear pain, sore throat and trouble swallowing.    Eyes: Negative for photophobia, pain, redness and visual disturbance.   Respiratory: Positive for shortness of breath. Negative for cough, chest tightness and wheezing.    Cardiovascular: Negative for chest pain and palpitations.   Gastrointestinal: Negative for abdominal pain, blood in stool, diarrhea, nausea and vomiting.   Genitourinary: Negative for dysuria and frequency.   Musculoskeletal: Negative for back pain, joint swelling and neck pain.   Skin: Negative for rash.   Neurological: Negative for seizures, syncope and headaches.    Psychiatric/Behavioral: Negative for behavioral problems and confusion. The patient is not nervous/anxious.        Vitals:    07/30/15 0951 07/30/15 1009   BP: (!) 140/92    Pulse: 81    Resp: 18    Temp: 98.2 ??F (36.8 ??C)    SpO2: 96% 97%   Weight: 99.8 kg (220 lb)    Height: 5\' 8"  (1.727 m)         10:25 AM Pulse Oximetry reading is 96 % on room air, which indicates normal oxygenation per Bronson Curb, MD.         Physical Exam   Constitutional: He is oriented to person, place, and time. He appears well-developed and well-nourished. No distress.   HENT:   Head: Normocephalic and atraumatic.   Eyes: Conjunctivae and EOM are normal. Pupils are equal, round, and reactive to light.   Neck: Normal range of motion. Neck supple.   Cardiovascular: Normal rate, regular rhythm and normal heart sounds.    Pulmonary/Chest: Effort normal and breath sounds normal. No respiratory distress. He has no wheezes.   Abdominal: Soft. Bowel sounds are normal.  There is no tenderness. There is no rebound and no guarding.   Musculoskeletal: Normal range of motion. He exhibits no edema or tenderness.   Neurological: He is alert and oriented to person, place, and time. No cranial nerve deficit.   Skin: Skin is warm and dry. No rash noted. No erythema.        MDM  Number of Diagnoses or Management Options  Asthma exacerbation:      Amount and/or Complexity of Data Reviewed  Decide to obtain previous medical records or to obtain history from someone other than the patient: yes  Review and summarize past medical records: yes  Independent visualization of images, tracings, or specimens: yes      ED Course       Procedures           I, Bronson Curb, MD, reviewed the patient's past history, allergies and home medications as documented in the nursing chart.    Labs:  No results found for this or any previous visit (from the past 12 hour(s)).      <EMERGENCY DEPARTMENT CASE SUMMARY>    Impression/Differential Diagnosis: Asthma vs Viral infection     ED Course:   49 y.o. male presented to the ED c/o shortness of breath for 1.5 hours. He will be given an Albuterol treatment and Prednisone in the department. He will be given a prescription for Albuterol and Prednisone. Patient???s symptoms Improved. I personally discussed discharge plan with patient/guardian, who understands instructions. All questions were answered.  Patient/guardian advised to follow up with PMD and/or specialist. Patient/guardian instructed to return to the ER if symptoms persist, change or worsen. Patient agrees with plan.      Final Impression/Diagnosis:   Encounter Diagnoses     ICD-10-CM ICD-9-CM   1. Asthma exacerbation J45.901 493.92       Patient condition at time of disposition: Stable      I have reviewed the following home medications:    Prior to Admission medications    Medication Sig Start Date End Date Taking? Authorizing Provider   albuterol (PROAIR HFA) 90 mcg/actuation inhaler Take 2 Puffs by inhalation every four (4) hours as needed for Wheezing or Shortness of Breath. 07/30/15  Yes Bronson Curb, MD   predniSONE (DELTASONE) 20 mg tablet Take 2 Tabs by mouth daily for 4 days. 07/30/15 08/03/15 Yes Bronson Curb, MD   OTHER Lost inhaler    Phys Other, MD       Bronson Curb, MD      I, Gwendlyn Deutscher, am serving as a scribe to document services personally performed by Bronson Curb, MD based on my observation and the provider's statements to me.    I, Bronson Curb, MD, attest that the person(s) noted above, acting as my scribe(s) noted above, has observed my performance of the services and has documented them in accordance with my direction. I have personally reviewed the above information and have ordered and reviewed the diagnostic studies, unless otherwise noted.

## 2015-07-30 NOTE — ED Triage Notes (Signed)
Difficulty breathing since this morning.  Similar to prior asthma attacks.

## 2015-07-30 NOTE — ED Notes (Signed)
Pt received discharge instructions verbally and in paper form, pt verbalized understanding. Ambulatory with steady gait. No s/s respiratory distress.

## 2015-10-11 ENCOUNTER — Inpatient Hospital Stay
Admit: 2015-10-11 | Discharge: 2015-10-11 | Disposition: A | Discharge status: Home / Self Care | Payer: MEDICAID | Attending: Personal Emergency Response Attendant

## 2015-10-11 DIAGNOSIS — J45901 Unspecified asthma with (acute) exacerbation: Secondary | ICD-10-CM

## 2015-10-11 MED ORDER — PREDNISONE 20 MG TAB
20 mg | Freq: Every day | ORAL | Status: DC
Start: 2015-10-11 — End: 2015-10-11

## 2015-10-11 MED ORDER — PREDNISONE 20 MG TAB
20 mg | ORAL | Status: AC
Start: 2015-10-11 — End: 2015-10-11
  Administered 2015-10-11: 16:00:00 via ORAL

## 2015-10-11 MED ORDER — PREDNISONE 20 MG TAB
20 mg | ORAL_TABLET | Freq: Every day | ORAL | 0 refills | Status: AC
Start: 2015-10-11 — End: 2015-10-16

## 2015-10-11 MED ORDER — ALBUTEROL SULFATE HFA 90 MCG/ACTUATION AEROSOL INHALER
90 mcg/actuation | RESPIRATORY_TRACT | Status: AC
Start: 2015-10-11 — End: 2015-10-11
  Administered 2015-10-11: 16:00:00 via RESPIRATORY_TRACT

## 2015-10-11 MED ORDER — IPRATROPIUM-ALBUTEROL 2.5 MG-0.5 MG/3 ML NEB SOLUTION
2.5 mg-0.5 mg/3 ml | RESPIRATORY_TRACT | Status: AC
Start: 2015-10-11 — End: 2015-10-11
  Administered 2015-10-11: 15:00:00

## 2015-10-11 MED ORDER — ALBUTEROL SULFATE HFA 90 MCG/ACTUATION AEROSOL INHALER
90 mcg/actuation | RESPIRATORY_TRACT | 0 refills | Status: DC | PRN
Start: 2015-10-11 — End: 2016-02-03

## 2015-10-11 MED FILL — VENTOLIN HFA 90 MCG/ACTUATION AEROSOL INHALER: 90 mcg/actuation | RESPIRATORY_TRACT | Qty: 8

## 2015-10-11 MED FILL — PREDNISONE 20 MG TAB: 20 mg | ORAL | Qty: 3

## 2015-10-11 MED FILL — IPRATROPIUM-ALBUTEROL 2.5 MG-0.5 MG/3 ML NEB SOLUTION: 2.5 mg-0.5 mg/3 ml | RESPIRATORY_TRACT | Qty: 3

## 2015-10-11 NOTE — ED Notes (Signed)
Pt. Discharged home with instructions to follow up with own PMD as advised and take rx Prednisone and Albuterol as directed, pt. Verbalized understanding.

## 2015-10-11 NOTE — ED Provider Notes (Signed)
The history is provided by the patient.      11:30 AM: Andrew Pearson is a 49 y.o. male with h/o asthma who presents to the ED c/o SOB and wheezing s/p allergy attack earlier today. Pt states that he did not have his his asthma pump. Pt denies any productive cough. Pt states that he is feeling much better. No other complaints at this time.    Pharmacy: [facility] in [place], [place]      Past Medical History:   Diagnosis Date   ??? Asthma        No past surgical history on file.      No family history on file.    Social History     Social History   ??? Marital status: SINGLE     Spouse name: N/A   ??? Number of children: N/A   ??? Years of education: N/A     Occupational History   ??? Not on file.     Social History Main Topics   ??? Smoking status: Heavy Tobacco Smoker     Packs/day: 0.50     Years: 15.00   ??? Smokeless tobacco: Not on file   ??? Alcohol use Yes      Comment: socially   ??? Drug use: Not on file   ??? Sexual activity: Yes     Partners: Female     Other Topics Concern   ??? Not on file     Social History Narrative         ALLERGIES: Review of patient's allergies indicates no known allergies.    Review of Systems   Constitutional: Negative for chills and fever.   HENT: Negative for rhinorrhea and sore throat.    Eyes: Negative for photophobia and visual disturbance.   Respiratory: Positive for shortness of breath and wheezing. Negative for cough.    Cardiovascular: Negative for chest pain and leg swelling.   Gastrointestinal: Negative for abdominal pain, diarrhea, nausea and vomiting.   Genitourinary: Negative for dysuria and hematuria.   Musculoskeletal: Negative for back pain and myalgias.   Skin: Negative for color change and pallor.   Neurological: Negative for seizures and syncope.       Vitals:    10/11/15 1042   BP: 117/88   Pulse: 64   Resp: 20   Temp: 97.4 ??F (36.3 ??C)   SpO2: 95%   Weight: 99.8 kg (220 lb)   Height:  (1.727 m)   11:34 AM Pulse Oximetry reading is 95% on room air, which indicates normal  oxygenation per Dennison Bulla, MD.              Physical Exam   Constitutional: He is oriented to person, place, and time. He appears well-developed and well-nourished. No distress.   HENT:   Head: Normocephalic and atraumatic.   Eyes: EOM are normal. Pupils are equal, round, and reactive to light.   Neck: Neck supple. No JVD present.   Cardiovascular: Normal rate, regular rhythm and normal heart sounds.    Pulmonary/Chest: Effort normal. No respiratory distress. He has wheezes (expiratory wheezes which cleared up after a few breaths). He has no rales.   Abdominal: Soft. Bowel sounds are normal. He exhibits no distension. There is no tenderness. There is no rebound and no guarding.   Musculoskeletal: Normal range of motion. He exhibits no edema or tenderness.   Neurological: He is alert and oriented to person, place, and time.   Skin: Skin is warm and  dry.   Psychiatric: He has a normal mood and affect.   Nursing note and vitals reviewed.       MDM  Number of Diagnoses or Management Options  Asthma with acute exacerbation, unspecified asthma severity:      Amount and/or Complexity of Data Reviewed  Decide to obtain previous medical records or to obtain history from someone other than the patient: yes  Review and summarize past medical records: yes  Independent visualization of images, tracings, or specimens: yes      ED Course       Procedures    I, Dennison Bullahantal Gabriel, MD, reviewed the patient's past history, allergies and home medications as documented in the nursing chart.        <EMERGENCY DEPARTMENT CASE SUMMARY>    Impression/Differential Diagnosis: asthma    ED Course:   49 y.o. male presented to the ED c/o SOB and wheezing. Give Duo-Neb    11:35 AM: Patient was reassessed prior to discharge. Patient???s symptoms Improved. I personally discussed discharge plan with patient/guardian, who understands instructions. All questions were answered.  Patient/guardian  advised to follow up with PMD and/or specialist. Patient/guardian instructed to return to the ER if symptoms persist, change or worsen. Give Profair HFA and Prednisone. Patient agrees with plan.      Final Impression/Diagnosis:   Encounter Diagnoses     ICD-10-CM ICD-9-CM   1. Asthma with acute exacerbation, unspecified asthma severity J45.901 493.92       Patient condition at time of disposition: stable      I have reviewed the following home medications:    Prior to Admission medications    Medication Sig Start Date End Date Taking? Authorizing Provider   albuterol (PROAIR HFA) 90 mcg/actuation inhaler Take 2 Puffs by inhalation every four (4) hours as needed for Wheezing. 10/11/15  Yes Dennison Bullahantal Gabriel, MD   predniSONE (DELTASONE) 20 mg tablet Take 2 Tabs by mouth daily for 5 days. With Breakfast 10/11/15 10/16/15 Yes Dennison Bullahantal Gabriel, MD   OTHER Lost inhaler    Phys Other, MD       Dennison Bullahantal Gabriel, MD      I, Phineas Semenhomas Candela, am serving as a scribe to document services personally performed by Dennison Bullahantal Gabriel, MD based on my observation and the provider's statements to me.    IDennison Bulla, Evyn Kooyman Gabriel, MD, attest that the person(s) noted above, acting as my scribe(s) noted above, has observed my performance of the services and has documented them in accordance with my direction. I have personally reviewed the above information and have ordered and reviewed the diagnostic studies, unless otherwise noted.

## 2015-10-11 NOTE — ED Triage Notes (Signed)
Pt. To ED for asthma exacerbation, ran out of Albuterol at home.

## 2016-01-10 ENCOUNTER — Inpatient Hospital Stay
Admit: 2016-01-10 | Discharge: 2016-01-10 | Disposition: A | Discharge status: Home / Self Care | Payer: MEDICAID | Attending: Personal Emergency Response Attendant

## 2016-01-10 DIAGNOSIS — J45901 Unspecified asthma with (acute) exacerbation: Secondary | ICD-10-CM

## 2016-01-10 MED ORDER — PREDNISONE 20 MG TAB
20 mg | Freq: Every day | ORAL | Status: DC
Start: 2016-01-10 — End: 2016-01-10

## 2016-01-10 MED ORDER — IPRATROPIUM-ALBUTEROL 2.5 MG-0.5 MG/3 ML NEB SOLUTION
2.5 mg-0.5 mg/3 ml | RESPIRATORY_TRACT | Status: AC
Start: 2016-01-10 — End: 2016-01-10
  Administered 2016-01-10: 17:00:00 via RESPIRATORY_TRACT

## 2016-01-10 MED ORDER — ALBUTEROL SULFATE HFA 90 MCG/ACTUATION AEROSOL INHALER
90 mcg/actuation | RESPIRATORY_TRACT | Status: AC
Start: 2016-01-10 — End: 2016-01-10
  Administered 2016-01-10: 19:00:00 via RESPIRATORY_TRACT

## 2016-01-10 MED ORDER — PREDNISONE 20 MG TAB
20 mg | ORAL | Status: AC
Start: 2016-01-10 — End: 2016-01-10
  Administered 2016-01-10: 19:00:00 via ORAL

## 2016-01-10 MED FILL — VENTOLIN HFA 90 MCG/ACTUATION AEROSOL INHALER: 90 mcg/actuation | RESPIRATORY_TRACT | Qty: 8

## 2016-01-10 MED FILL — IPRATROPIUM-ALBUTEROL 2.5 MG-0.5 MG/3 ML NEB SOLUTION: 2.5 mg-0.5 mg/3 ml | RESPIRATORY_TRACT | Qty: 3

## 2016-01-10 MED FILL — PREDNISONE 20 MG TAB: 20 mg | ORAL | Qty: 3

## 2016-01-10 NOTE — ED Triage Notes (Signed)
Patient states he has asthma and ran out of his inhaler yesterday.  Uses at least 3-4 times a day routinely.

## 2016-01-10 NOTE — ED Provider Notes (Signed)
The history is provided by the patient.    12:00 PM: LINK BURGESON is a 50 y.o. male with h/o asthma who presents to the ED c/o wheezing onset earlier this morning. Pt ran out of her inhaler. Pt denies URI symptoms, fever, or cough. No other complaints at this time.       Past Medical History:   Diagnosis Date   ??? Asthma        History reviewed. No pertinent past surgical history.      History reviewed. No pertinent family history.    Social History     Social History   ??? Marital status: SINGLE     Spouse name: N/A   ??? Number of children: N/A   ??? Years of education: N/A     Occupational History   ??? Not on file.     Social History Main Topics   ??? Smoking status: Heavy Tobacco Smoker     Packs/day: 0.50     Years: 15.00   ??? Smokeless tobacco: Not on file   ??? Alcohol use Yes      Comment: socially   ??? Drug use: Not on file   ??? Sexual activity: Yes     Partners: Female     Other Topics Concern   ??? Not on file     Social History Narrative         ALLERGIES: Review of patient's allergies indicates no known allergies.    Review of Systems   Constitutional: Negative for chills and fever.   HENT: Negative for rhinorrhea and sore throat.    Eyes: Negative for photophobia and visual disturbance.   Respiratory: Positive for wheezing. Negative for cough and shortness of breath.    Cardiovascular: Negative for chest pain and leg swelling.   Gastrointestinal: Negative for abdominal pain, diarrhea, nausea and vomiting.   Genitourinary: Negative for dysuria and hematuria.   Musculoskeletal: Negative for back pain and myalgias.   Skin: Negative for color change and pallor.   Neurological: Negative for seizures and syncope.       Vitals:    01/10/16 1137   BP: (!) 152/93   Pulse: 76   Resp: 16   Temp: 97.9 ??F (36.6 ??C)   SpO2: 95%   Weight: 104.3 kg (230 lb)   Height:  (1.727 m)     1:28 PM Pulse Oximetry reading is 95 % on room air, which indicates normal oxygenation per Dennison Bulla, MD.          Physical Exam    Constitutional: He is oriented to person, place, and time. He appears well-developed and well-nourished. No distress.   HENT:   Head: Normocephalic and atraumatic.   Eyes: EOM are normal. Pupils are equal, round, and reactive to light.   Neck: Neck supple. No JVD present.   Cardiovascular: Normal rate, regular rhythm and normal heart sounds.    Pulmonary/Chest: Effort normal. No respiratory distress. He has wheezes (diffuse). He has no rales.   Abdominal: Soft. Bowel sounds are normal. He exhibits no distension. There is no tenderness. There is no rebound and no guarding.   Musculoskeletal: Normal range of motion. He exhibits no edema or tenderness.   Neurological: He is alert and oriented to person, place, and time.   Skin: Skin is warm and dry.   Psychiatric: He has a normal mood and affect.   Nursing note and vitals reviewed.       MDM  Number of Diagnoses  or Management Options     Amount and/or Complexity of Data Reviewed  Decide to obtain previous medical records or to obtain history from someone other than the patient: yes  Review and summarize past medical records: yes  Independent visualization of images, tracings, or specimens: yes      ED Course       Procedures      I, Dennison Bulla, MD, reviewed the patient's past history, allergies and home medications as documented in the nursing chart.    Labs:  No results found for this or any previous visit (from the past 12 hour(s)).    EKG:     Radiology:  CXR Results  (Last 48 hours)    None        CT Results  (Last 48 hours)    None        Echo Results  (Last 48 hours)    None           <EMERGENCY DEPARTMENT CASE SUMMARY>    Impression/Differential Diagnosis: Asthma exacerbation    ED Course:   50 y.o. male presented to the ED c/o wheezing onset earlier this morning. 2 DUO-NEB's and 60 mg Prednisone given.    Final Impression/Diagnosis:   No diagnosis found.    Patient condition at time of disposition: stable       I have reviewed the following home medications:    Prior to Admission medications    Medication Sig Start Date End Date Taking? Authorizing Provider   albuterol (PROAIR HFA) 90 mcg/actuation inhaler Take 2 Puffs by inhalation every four (4) hours as needed for Wheezing. 10/11/15  Yes Dennison Bulla, MD       Dennison Bulla, MD      I, Daylene Katayama, am serving as a scribe to document services personally performed by Dennison Bulla, MD based on my observation and the provider's statements to me.    IDennison Bulla, MD, attest that the person(s) noted above, acting as my scribe(s) noted above, has observed my performance of the services and has documented them in accordance with my direction. I have personally reviewed the above information and have ordered and reviewed the diagnostic studies, unless otherwise noted.

## 2016-01-10 NOTE — ED Notes (Signed)
Pt discharge home with instructions given to f/u with pmd, pt verbalized understanding.

## 2016-02-03 ENCOUNTER — Inpatient Hospital Stay
Admit: 2016-02-03 | Discharge: 2016-02-03 | Disposition: A | Discharge status: Home / Self Care | Payer: MEDICAID | Attending: Emergency Medicine

## 2016-02-03 DIAGNOSIS — J4541 Moderate persistent asthma with (acute) exacerbation: Secondary | ICD-10-CM

## 2016-02-03 MED ORDER — ALBUTEROL SULFATE HFA 90 MCG/ACTUATION AEROSOL INHALER
90 mcg/actuation | RESPIRATORY_TRACT | 0 refills | Status: AC | PRN
Start: 2016-02-03 — End: ?

## 2016-02-03 MED ORDER — IPRATROPIUM-ALBUTEROL 2.5 MG-0.5 MG/3 ML NEB SOLUTION
2.5 mg-0.5 mg/3 ml | RESPIRATORY_TRACT | Status: AC
Start: 2016-02-03 — End: 2016-02-03
  Administered 2016-02-03: 06:00:00 via RESPIRATORY_TRACT

## 2016-02-03 MED ORDER — IPRATROPIUM-ALBUTEROL 2.5 MG-0.5 MG/3 ML NEB SOLUTION
2.5 mg-0.5 mg/3 ml | RESPIRATORY_TRACT | Status: AC
Start: 2016-02-03 — End: 2016-02-03

## 2016-02-03 MED FILL — IPRATROPIUM-ALBUTEROL 2.5 MG-0.5 MG/3 ML NEB SOLUTION: 2.5 mg-0.5 mg/3 ml | RESPIRATORY_TRACT | Qty: 6

## 2016-02-03 MED FILL — IPRATROPIUM-ALBUTEROL 2.5 MG-0.5 MG/3 ML NEB SOLUTION: 2.5 mg-0.5 mg/3 ml | RESPIRATORY_TRACT | Qty: 3

## 2016-02-03 NOTE — ED Notes (Signed)
Pts SOB and wheezing much improved following neb txs.Pt d/c in NAD

## 2016-02-03 NOTE — ED Triage Notes (Signed)
Ran out of albuterol pump. Has not been to the doctor due to insurance

## 2016-02-03 NOTE — ED Notes (Signed)
Recd pt awake and alert.Pt SOB and wheezing x 2 hrs.Pt ran out of home meds.pt for md eval and tx

## 2016-02-03 NOTE — ED Provider Notes (Addendum)
The history is provided by the patient.      12:53 AM: Andrew Pearson is a 50 y.o. male with history of poorly controlled asthma who presents to the Emergency Department complaining of increased wheezing onset today and inability to refill asthma medication.  He has been lost to follow up with  PMD. Patient denies fever. Patient states he's been without his rescue inhaler for 48 hours. Patient reports that he usually uses it 3 times a day. Patient has no other complaints at this time.     Past Medical History:   Diagnosis Date   ??? Asthma        History reviewed. No pertinent surgical history.      History reviewed. No pertinent family history.    Social History     Social History   ??? Marital status: SINGLE     Spouse name: N/A   ??? Number of children: N/A   ??? Years of education: N/A     Occupational History   ??? Not on file.     Social History Main Topics   ??? Smoking status: Heavy Tobacco Smoker     Packs/day: 0.50     Years: 15.00   ??? Smokeless tobacco: Not on file   ??? Alcohol use Yes      Comment: socially   ??? Drug use: Not on file   ??? Sexual activity: Yes     Partners: Female     Other Topics Concern   ??? Not on file     Social History Narrative         ALLERGIES: Review of patient's allergies indicates no known allergies.    Review of Systems   Constitutional: Negative for chills and fever.   HENT: Negative for rhinorrhea and sore throat.    Eyes: Negative for photophobia and visual disturbance.   Respiratory: Positive for shortness of breath. Negative for cough.    Cardiovascular: Negative for chest pain and leg swelling.   Gastrointestinal: Negative for abdominal pain, diarrhea, nausea and vomiting.   Genitourinary: Negative for dysuria and hematuria.   Musculoskeletal: Negative for back pain and myalgias.   Skin: Negative for color change and pallor.   Neurological: Negative for seizures and syncope.   All other systems reviewed and are negative.      Vitals:    02/03/16 0019 02/03/16 0040   BP: 139/83     Pulse: 80    Resp: 18    Temp: 98.1 ??F (36.7 ??C)    SpO2: 95% 95%   Weight: 104.3 kg (230 lb)    Height:  (1.753 m)         12:53 AM Pulse Oximetry reading is 95 % on room air, which indicates normal oxygenation per Erasmo Downer, MD.     Physical Exam   Constitutional: He is oriented to person, place, and time. He appears well-developed and well-nourished. No distress.   Patient is able to speak clearly in complete sentences   HENT:   Head: Normocephalic and atraumatic.   Eyes: EOM are normal. Pupils are equal, round, and reactive to light.   Neck: Neck supple. No JVD present.   Cardiovascular: Normal rate, regular rhythm and normal heart sounds.    Pulmonary/Chest: Effort normal. No respiratory distress. He has wheezes (inspiratory and expiratory ). He has no rales.   Abdominal: Soft. Bowel sounds are normal. He exhibits no distension. There is no tenderness. There is no rebound and no guarding.  Musculoskeletal: Normal range of motion. He exhibits no edema or tenderness.   Neurological: He is alert and oriented to person, place, and time.   Skin: Skin is warm and dry.   Psychiatric: He has a normal mood and affect.   Nursing note and vitals reviewed.       MDM  Number of Diagnoses or Management Options  Moderate persistent asthma with acute exacerbation: established and improving  Risk of Complications, Morbidity, and/or Mortality  Presenting problems: moderate  Diagnostic procedures: low  Management options: low    Patient Progress  Patient progress: stable    ED Course       Procedures    I, Joaquim Nam, NP, reviewed the patient's past history, allergies and home medications as documented in the nursing chart.       <EMERGENCY DEPARTMENT CASE SUMMARY>    Impression/Differential Diagnosis: asthma exacerbation     ED Course:     Patient presents to ED complaining of shortness of breath. Plan is to provide Duo-nebs and reevaluate.      0200:  Pt tolerated nebs well.  No wheezing at present. He is able to converse without difficulty.      2:02 AM: Patient was reassessed prior to discharge. Patient???s symptoms Improved. I personally discussed discharge plan with patient/guardian, who understands instructions. All questions were answered.  Patient/guardian advised to follow up with PMD and/or specialist. Patient/guardian instructed to return to the ER if symptoms persist, change or worsen. Patient agrees with plan.    Final Impression/Diagnosis:   Encounter Diagnoses     ICD-10-CM ICD-9-CM   1. Moderate persistent asthma with acute exacerbation J45.41 [id number]       Patient condition at time of disposition: stable      I have reviewed the following home medications:    Prior to Admission medications    Medication Sig Start Date End Date Taking? Authorizing Provider   albuterol (PROVENTIL HFA) 90 mcg/actuation inhaler Take 1 Puff by inhalation every four (4) hours as needed for Wheezing. Indications: Acute Asthma Attack 02/03/16  Yes Joaquim Nam, NP   albuterol [facility]) 90 mcg/actuation inhaler Take 2 Puffs by inhalation every four (4) hours as needed for Wheezing. 02/03/16  Yes Joaquim Nam, NP       Joaquim Nam, NP     I, Toula Moos, am serving as a scribe to document services personally performed by Joaquim Nam, NP based on my observation and the provider's statements to me.    Sterling Big, NP, attest that the person(s) noted above, acting as my scribe(s) noted above, has observed my performance of the services and has documented them in accordance with my direction. I have personally reviewed the above information and have ordered and reviewed the diagnostic studies, unless otherwise noted.
